# Patient Record
Sex: Male | Born: 1990 | Race: Black or African American | Hispanic: No | Marital: Single | Smoking: Current every day smoker
Health system: Southern US, Community
[De-identification: ages and names within clinical notes are randomized; demographics above are authoritative.]

## PROBLEM LIST (undated history)

## (undated) DIAGNOSIS — F319 Bipolar disorder, unspecified: Secondary | ICD-10-CM

## (undated) DIAGNOSIS — F909 Attention-deficit hyperactivity disorder, unspecified type: Secondary | ICD-10-CM

## (undated) DIAGNOSIS — J45909 Unspecified asthma, uncomplicated: Secondary | ICD-10-CM

## (undated) DIAGNOSIS — F209 Schizophrenia, unspecified: Secondary | ICD-10-CM

---
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
## 2010-07-13 LAB — GC/CHLAMYDIA PROBE AMP, GENITAL
Chlamydia, DNA Probe: POSITIVE — AB
GC Probe Amp, Genital: NEGATIVE

## 2010-12-18 ENCOUNTER — Inpatient Hospital Stay (INDEPENDENT_AMBULATORY_CARE_PROVIDER_SITE_OTHER)
Admission: RE | Admit: 2010-12-18 | Discharge: 2010-12-18 | Disposition: A | Discharge status: Home / Self Care | Payer: Medicaid Other | Source: Ambulatory Visit | Attending: Family Medicine | Admitting: Family Medicine

## 2010-12-18 DIAGNOSIS — N476 Balanoposthitis: Secondary | ICD-10-CM

## 2010-12-19 LAB — RPR: RPR Ser Ql: NONREACTIVE

## 2012-03-03 ENCOUNTER — Emergency Department (HOSPITAL_COMMUNITY)
Admission: EM | Admit: 2012-03-03 | Discharge: 2012-03-03 | Disposition: A | Discharge status: Home / Self Care | Payer: Medicaid Other | Attending: Emergency Medicine | Admitting: Emergency Medicine

## 2012-03-03 ENCOUNTER — Emergency Department (HOSPITAL_COMMUNITY): Payer: Medicaid Other

## 2012-03-03 ENCOUNTER — Encounter (HOSPITAL_COMMUNITY): Payer: Self-pay | Admitting: Emergency Medicine

## 2012-03-03 DIAGNOSIS — J45909 Unspecified asthma, uncomplicated: Secondary | ICD-10-CM | POA: Insufficient documentation

## 2012-03-03 DIAGNOSIS — R0981 Nasal congestion: Secondary | ICD-10-CM

## 2012-03-03 DIAGNOSIS — J40 Bronchitis, not specified as acute or chronic: Secondary | ICD-10-CM | POA: Insufficient documentation

## 2012-03-03 DIAGNOSIS — J3489 Other specified disorders of nose and nasal sinuses: Secondary | ICD-10-CM | POA: Insufficient documentation

## 2012-03-03 DIAGNOSIS — F17200 Nicotine dependence, unspecified, uncomplicated: Secondary | ICD-10-CM | POA: Insufficient documentation

## 2012-03-03 HISTORY — DX: Unspecified asthma, uncomplicated: J45.909

## 2012-03-03 MED ORDER — PSEUDOEPHEDRINE HCL ER 120 MG PO TB12: 120.0000 mg | ORAL_TABLET | Freq: Two times a day (BID) | ORAL | Status: DC

## 2012-03-03 MED ORDER — PSEUDOEPHEDRINE HCL ER 120 MG PO TB12
120.0000 mg | ORAL_TABLET | Freq: Two times a day (BID) | ORAL | Status: DC
  Administered 2012-03-03: 120 mg via ORAL
  Filled 2012-03-03: qty 1

## 2012-03-03 MED ORDER — SALINE SPRAY 0.65 % NA SOLN
1.0000 | Freq: Once | NASAL | Status: AC
  Administered 2012-03-03: 1 via NASAL
  Filled 2012-03-03: qty 44

## 2012-03-03 MED ORDER — ALBUTEROL SULFATE HFA 108 (90 BASE) MCG/ACT IN AERS
2.0000 | INHALATION_SPRAY | Freq: Four times a day (QID) | RESPIRATORY_TRACT | Status: DC
  Administered 2012-03-03: 2 via RESPIRATORY_TRACT
  Filled 2012-03-03: qty 6.7

## 2012-03-03 NOTE — ED Provider Notes (Signed)
History     CSN: [id number]  Arrival date & time 03/03/12  0206   First MD Initiated Contact with Patient 03/03/12 0308      Chief Complaint  Patient presents with  . Shortness of Breath    (Consider location/radiation/quality/duration/timing/severity/associated sxs/prior treatment) HPI Comments: Patient has URI, symptoms, rhinitis, nonproductive cough.  He, feels, is no his cough is getting worse.  Denies any fever, states she's been taking multiple over-the-counter medications, with no relief  Patient is a 21 y.o. male presenting with shortness of breath. The history is provided by the patient.  Shortness of Breath  The current episode started more than 1 week ago. The problem has been gradually worsening. The problem is moderate. Nothing relieves the symptoms. Nothing aggravates the symptoms. Associated symptoms include rhinorrhea, cough, shortness of breath and wheezing. Pertinent negatives include no chest pain and no fever.    Past Medical History  Diagnosis Date  . Asthma     History reviewed. No pertinent past surgical history.  No family history on file.  History  Substance Use Topics  . Smoking status: Current Every Day Smoker  . Smokeless tobacco: Not on file  . Alcohol Use: No      Review of Systems  Constitutional: Negative for fever and chills.  HENT: Positive for congestion and rhinorrhea.   Respiratory: Positive for cough, shortness of breath and wheezing.   Cardiovascular: Negative for chest pain.  Neurological: Negative for dizziness, weakness and headaches.    Allergies  Mushroom extract complex; Oatmeal; and Risperdal  Home Medications  No current outpatient prescriptions on file.  BP 145/98  Pulse 86  Temp 97.9 F (36.6 C) (Oral)  Resp 14  SpO2 100%  Physical Exam  Constitutional: He appears well-developed and well-nourished.  HENT:  Head: Normocephalic.  Mouth/Throat: Oropharynx is clear and moist.  Eyes: Pupils are equal, round,  and reactive to light.  Neck: Normal range of motion.  Cardiovascular: Normal rate.   Pulmonary/Chest: No respiratory distress. He has wheezes.  Musculoskeletal: Normal range of motion.  Skin: Skin is warm.    ED Course  Procedures (including critical care time)  Labs Reviewed - No data to display Dg Chest 2 View  03/03/2012  *RADIOLOGY REPORT*  Clinical Data: Shortness of breath, cough and fever.  History of asthma.  CHEST - 2 VIEW  Comparison: None.  Findings: The lungs are well-aerated and clear.  There is no evidence of focal opacification, pleural effusion or pneumothorax.  The heart is normal in size; the mediastinal contour is within normal limits.  No acute osseous abnormalities are seen.  IMPRESSION: No acute cardiopulmonary process seen.   Original Report Authenticated By: Tonia Ghent, M.D.      No diagnosis found.    MDM   Chest x-ray is reviewed.  There is no indication of pneumonia.  He was treated with albuterol inhaler, Sudafed, and saline nasal spray        Arman Filter, NP 03/03/12 1478

## 2012-03-03 NOTE — ED Notes (Signed)
The patient is AOx4 and comfortable with his discharge instructions. 

## 2012-03-03 NOTE — ED Provider Notes (Signed)
Medical screening examination/treatment/procedure(s) were performed by non-physician practitioner and as supervising physician I was immediately available for consultation/collaboration.    Vida Roller, MD 03/03/12 [phone]

## 2012-03-03 NOTE — ED Notes (Signed)
PT. ARRIVED WITH PTAR REPORTS SOB WITH PRODUCTIVE COUGH AND  NASAL CONGESTION ONSET 1 WEEK AGO.

## 2012-07-05 ENCOUNTER — Emergency Department (HOSPITAL_COMMUNITY)
Admission: EM | Admit: 2012-07-05 | Discharge: 2012-07-06 | Disposition: A | Discharge status: Home / Self Care | Payer: Medicaid Other | Attending: Emergency Medicine | Admitting: Emergency Medicine

## 2012-07-05 ENCOUNTER — Encounter (HOSPITAL_COMMUNITY): Payer: Self-pay | Admitting: Emergency Medicine

## 2012-07-05 DIAGNOSIS — F329 Major depressive disorder, single episode, unspecified: Secondary | ICD-10-CM

## 2012-07-05 DIAGNOSIS — R45851 Suicidal ideations: Secondary | ICD-10-CM

## 2012-07-05 DIAGNOSIS — M549 Dorsalgia, unspecified: Secondary | ICD-10-CM | POA: Insufficient documentation

## 2012-07-05 DIAGNOSIS — F3130 Bipolar disorder, current episode depressed, mild or moderate severity, unspecified: Secondary | ICD-10-CM | POA: Insufficient documentation

## 2012-07-05 DIAGNOSIS — Z8659 Personal history of other mental and behavioral disorders: Secondary | ICD-10-CM | POA: Insufficient documentation

## 2012-07-05 DIAGNOSIS — F17200 Nicotine dependence, unspecified, uncomplicated: Secondary | ICD-10-CM | POA: Insufficient documentation

## 2012-07-05 DIAGNOSIS — F319 Bipolar disorder, unspecified: Secondary | ICD-10-CM | POA: Insufficient documentation

## 2012-07-05 HISTORY — DX: Schizophrenia, unspecified: F20.9

## 2012-07-05 HISTORY — DX: Bipolar disorder, unspecified: F31.9

## 2012-07-05 HISTORY — DX: Attention-deficit hyperactivity disorder, unspecified type: F90.9

## 2012-07-05 LAB — CBC
HCT: 40.4 % (ref 39.0–52.0)
MCHC: 34.4 g/dL (ref 30.0–36.0)
MCV: 82.6 fL (ref 78.0–100.0)
Platelets: 215 10*3/uL (ref 150–400)
RDW: 13.9 % (ref 11.5–15.5)

## 2012-07-05 LAB — COMPREHENSIVE METABOLIC PANEL
AST: 38 U/L — ABNORMAL HIGH (ref 0–37)
Albumin: 3.9 g/dL (ref 3.5–5.2)
BUN: 12 mg/dL (ref 6–23)
Creatinine, Ser: 1.06 mg/dL (ref 0.50–1.35)
Potassium: 3.7 mEq/L (ref 3.5–5.1)
Total Protein: 6.5 g/dL (ref 6.0–8.3)

## 2012-07-05 LAB — RAPID URINE DRUG SCREEN, HOSP PERFORMED
Amphetamines: NOT DETECTED
Benzodiazepines: NOT DETECTED
Cocaine: NOT DETECTED

## 2012-07-05 LAB — SALICYLATE LEVEL: Salicylate Lvl: <2 mg/dL — ABNORMAL LOW (ref 2.8–20.0)

## 2012-07-05 LAB — ACETAMINOPHEN LEVEL: Acetaminophen (Tylenol), Serum: <15 ug/mL (ref 10–30)

## 2012-07-05 LAB — ETHANOL: Alcohol, Ethyl (B): <11 mg/dL (ref 0–11)

## 2012-07-05 MED ORDER — IBUPROFEN 400 MG PO TABS: 600.0000 mg | ORAL_TABLET | Freq: Three times a day (TID) | ORAL | Status: DC | PRN

## 2012-07-05 MED ORDER — ALUM & MAG HYDROXIDE-SIMETH 200-200-20 MG/5ML PO SUSP: 30.0000 mL | ORAL | Status: DC | PRN

## 2012-07-05 MED ORDER — ZOLPIDEM TARTRATE 5 MG PO TABS: 5.0000 mg | ORAL_TABLET | Freq: Every evening | ORAL | Status: DC | PRN

## 2012-07-05 MED ORDER — IBUPROFEN 800 MG PO TABS: 800.0000 mg | ORAL_TABLET | Freq: Once | ORAL | Status: DC

## 2012-07-05 MED ORDER — NICOTINE 21 MG/24HR TD PT24: 21.0000 mg | MEDICATED_PATCH | Freq: Every day | TRANSDERMAL | Status: DC

## 2012-07-05 MED ORDER — ACETAMINOPHEN 325 MG PO TABS: 650.0000 mg | ORAL_TABLET | ORAL | Status: DC | PRN

## 2012-07-05 MED ORDER — ONDANSETRON HCL 8 MG PO TABS: 4.0000 mg | ORAL_TABLET | Freq: Three times a day (TID) | ORAL | Status: DC | PRN

## 2012-07-05 NOTE — ED Notes (Signed)
Patient sleeping comfortably and is without any complaints at this time. Will continue to monitor.
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
## 2012-07-05 NOTE — ED Provider Notes (Signed)
Medical screening examination/treatment/procedure(s) were performed by non-physician practitioner and as supervising physician I was immediately available for consultation/collaboration.   Whitney Plunkett, MD 07/05/12 2347 

## 2012-07-05 NOTE — ED Notes (Signed)
Patient belongings inventoried and secured in ED lockers #10 & 11
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
## 2012-07-05 NOTE — ED Notes (Signed)
Pt ask for something to eat. Pt given a happy meal and sprite to drink.

## 2012-07-05 NOTE — ED Notes (Signed)
Pt changing into blue paper scrubs.  Christa, Consulting civil engineer notified of pt and security called to wand pt.
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
## 2012-07-06 MED ORDER — DIVALPROEX SODIUM 250 MG PO DR TAB: 250.0000 mg | DELAYED_RELEASE_TABLET | Freq: Two times a day (BID) | ORAL | Status: DC
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
## 2012-07-06 NOTE — ED Notes (Signed)
Patient resting comfortably. No needs assessed at this time. Will continue to monitor.  

## 2012-07-06 NOTE — ED Notes (Signed)
Case manager in to see pt to address his housing need

## 2012-07-06 NOTE — ED Notes (Signed)
Patient sleeping comfortably. No needs assessed at this time. Will continue to monitor.  

## 2012-07-06 NOTE — BH Assessment (Signed)
Assessment Note  Update:  Pt received telepsych and discharge recommended by psychiatrist, as pt denies current SI/HI or psychosis.  Pt also had medication recommendations and prescription was provided by EDP Knapp.  PJ Minnich, RNCM, provided housing resources to pt.  Pt was given outpatient referrals to [facility] and other facilities as well as crisis hotline information.  Pt refused to sign a No Harm or No Violence contract.  Pt to be discharged.  Updated ED staff.  Updated assessment disposition, completed assessment notification and faxed to [facility] to log.    Disposition:  Disposition Initial Assessment Completed: Yes Disposition of Patient: Referred to;Outpatient treatment Type of inpatient treatment program: Adult Type of outpatient treatment: Adult Patient referred to: Outpatient clinic referral  On Site Evaluation by:   Reviewed with Physician:  Pat Patrick, Rennis Harding 07/06/2012 11:36 AM

## 2012-07-06 NOTE — ED Provider Notes (Addendum)
Pt resting this morning.  VSS.  Will continue to monitor.  Awaiting psychiatric disposition.  Filed Vitals:   07/06/12 0627  BP: 97/53  Pulse: 55  Temp:   Resp: 18     Celene Kras, MD 07/06/12 [phone]  Pt was evaluated by Dr Leretha Pol this morning.  Pt denies SI at this time.  He discused the stressors with Dr Leretha Pol.  She recommends discharge and starting him on Depakote 250 mg BID.  Will ask social work to help him with finding a shelter.  Celene Kras, MD 07/06/12 [phone]

## 2012-07-06 NOTE — BH Assessment (Signed)
[facility] Assessment Progress Note      Update:  Called [place], no beds per [facility] @ 0805.  Called [place] and no beds per [facility] @ [phone].  Called [place] and no beds per [facility] @ [phone].  Called [place] and beds available per [facility] @ [phone].  Referral faxed for review.  Called [facility] and beds available per Alecia Lemming @ [phone].  Referral faxed for review.

## 2012-07-06 NOTE — ED Notes (Signed)
Patient sleeping comfortably. No needs assessed at this time. Will continue to monitor.
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
## 2012-07-06 NOTE — [facility] Counselor (Signed)
Patient has been accepted at [facility] by Neil Mashburn PA pending bed availability. 

## 2012-07-06 NOTE — ED Notes (Signed)
Patient is sleeping comfortably. No needs assessed at this time. Will continue to monitor.  

## 2014-06-24 ENCOUNTER — Encounter (HOSPITAL_COMMUNITY): Payer: Self-pay | Admitting: Emergency Medicine

## 2014-06-24 ENCOUNTER — Emergency Department (HOSPITAL_COMMUNITY)
Admission: EM | Admit: 2014-06-24 | Discharge: 2014-06-24 | Disposition: A | Discharge status: Home / Self Care | Payer: Medicaid Other | Attending: Emergency Medicine | Admitting: Emergency Medicine

## 2014-06-24 DIAGNOSIS — R51 Headache: Secondary | ICD-10-CM | POA: Diagnosis not present

## 2014-06-24 DIAGNOSIS — R0981 Nasal congestion: Secondary | ICD-10-CM

## 2014-06-24 DIAGNOSIS — Z720 Tobacco use: Secondary | ICD-10-CM | POA: Insufficient documentation

## 2014-06-24 DIAGNOSIS — Z79899 Other long term (current) drug therapy: Secondary | ICD-10-CM | POA: Insufficient documentation

## 2014-06-24 DIAGNOSIS — F319 Bipolar disorder, unspecified: Secondary | ICD-10-CM | POA: Insufficient documentation

## 2014-06-24 DIAGNOSIS — J45909 Unspecified asthma, uncomplicated: Secondary | ICD-10-CM | POA: Insufficient documentation

## 2014-06-24 DIAGNOSIS — R519 Headache, unspecified: Secondary | ICD-10-CM

## 2014-06-24 MED ORDER — KETOROLAC TROMETHAMINE 30 MG/ML IJ SOLN
30.0000 mg | Freq: Once | INTRAMUSCULAR | Status: AC
  Administered 2014-06-24: 30 mg via INTRAVENOUS
  Filled 2014-06-24: qty 1

## 2014-06-24 MED ORDER — IBUPROFEN 800 MG PO TABS
800.0000 mg | ORAL_TABLET | Freq: Three times a day (TID) | ORAL | Status: DC
Start: 2014-06-24 — End: 2016-01-29

## 2014-06-24 MED ORDER — ONDANSETRON HCL 4 MG/2ML IJ SOLN
4.0000 mg | Freq: Once | INTRAMUSCULAR | Status: AC
  Administered 2014-06-24: 4 mg via INTRAVENOUS
  Filled 2014-06-24: qty 2

## 2014-06-24 NOTE — Discharge Instructions (Signed)
Take the prescribed medication as directed shoulder headache recur. Return to the ED for new or worsening symptoms.

## 2014-06-24 NOTE — ED Notes (Signed)
Pt arrives with c/o headache ongoing since yesterday, nausea. States he tried Programmer, systemsalza seltzer for headache and was ineffective. R sided headache, no hx of migraines.

## 2014-06-24 NOTE — ED Provider Notes (Signed)
CSN: [id number]     Arrival date & time 06/24/14  0618 History   First MD Initiated Contact with Patient 06/24/14 [phone]     Chief Complaint  Patient presents with  . Headache  . Nasal Congestion     (Consider location/radiation/quality/duration/timing/severity/associated sxs/prior Treatment) The history is provided by the patient and medical records.    This is a 24 y.o. M with PMH significant for bipolar disorder, ADHD, schizophrenia, asthma, presenting to the ED for headache.  Patient states headache began yesterday evening, localized to the right side of his head, throbbing in nature.  He denies associated photophobia, phonophobia, dizziness, aura, visual disturbance, tinnitus, numbness, paresthesias, or weakness.  Patient has no hx of migraines.  He notes associated nausea but denies vomiting.  No abdominal pain.  No fever, chills, sweats.  No neck pain or stiffness.  Patient also notes some nasal congestion which began yesterday.  He denies any cough, chest pain, or SOB.  No known sick contacts. Patient took alka seltzer PTA, no other medications tried.  VSS on arrival.  Past Medical History  Diagnosis Date  . Bipolar 1 disorder   . ADHD (attention deficit hyperactivity disorder)   . Schizophrenia   . Asthma    History reviewed. No pertinent past surgical history. No family history on file. History  Substance Use Topics  . Smoking status: Current Every Day Smoker  . Smokeless tobacco: Not on file  . Alcohol Use: Yes    Review of Systems  HENT: Positive for congestion.   Neurological: Positive for headaches.  All other systems reviewed and are negative.     Allergies  Klonopin; Other; and Risperdal  Home Medications   Prior to Admission medications   Medication Sig Start Date End Date Taking? Authorizing Provider  divalproex (DEPAKOTE) 250 MG DR tablet Take 1 tablet (250 mg total) by mouth 2 (two) times daily. 07/06/12   Linwood Dibbles, MD   BP 126/84 mmHg  Pulse 62   Temp(Src) 98.7 F (37.1 C) (Oral)  Resp 16  Ht  (1.727 m)  Wt 195 lb (88.451 kg)  BMI 29.66 kg/m2  SpO2 100%   Physical Exam  Constitutional: He is oriented to person, place, and time. He appears well-developed and well-nourished.  HENT:  Head: Normocephalic and atraumatic.  Right Ear: Tympanic membrane normal.  Left Ear: Tympanic membrane and ear canal normal.  Nose: Mucosal edema present.  Mouth/Throat: Uvula is midline, oropharynx is clear and moist and mucous membranes are normal. No oropharyngeal exudate, posterior oropharyngeal edema, posterior oropharyngeal erythema or tonsillar abscesses.  Nasal congestion noted; Tonsils normal in appearance bilaterally without exudate; uvula midline without peritonsillar abscess; handling secretions appropriately; no difficulty swallowing or speaking  Eyes: Conjunctivae and EOM are normal. Pupils are equal, round, and reactive to light.  Neck: Normal range of motion and full passive range of motion without pain. Neck supple. No muscular tenderness present. No rigidity. No edema present.  No meningeal signs  Cardiovascular: Normal rate, regular rhythm and normal heart sounds.   Pulmonary/Chest: Effort normal and breath sounds normal.  Abdominal: Soft. Bowel sounds are normal.  Musculoskeletal: Normal range of motion.  Neurological: He is alert and oriented to person, place, and time.  AAOx3, answering questions and following commands appropriately; equal strength UE and LE bilaterally; CN grossly intact; moves all extremities appropriately without ataxia; no focal neuro deficits or facial asymmetry appreciated  Skin: Skin is warm and dry.  Psychiatric: He has a normal mood and  affect.  Nursing note and vitals reviewed.   ED Course  Procedures (including critical care time) Labs Review Labs Reviewed - No data to display  Imaging Review No results found.   EKG Interpretation None      MDM   Final diagnoses:  Headache,  unspecified headache type  Nasal congestion   24 year old male with progressively worsening headache past 24 hours. He has associated nausea but denies vomiting. On exam, patient afebrile and nontoxic in appearance. He has no signs of meningitis. Neurologic exam is nonfocal. Nasal congestion without clinical signs of infection.  Suspect uncomplicated headache with URI. Patient given toradol and Zofran with resolution of symptoms.  Patient will be d/c home with motrin.  Discussed plan with patient, he/she acknowledged understanding and agreed with plan of care.  Return precautions given for new or worsening symptoms.  Garlon HatchetLisa M Versie Fleener, PA-C 06/24/14 0830  Lyanne CoKevin M Campos, MD 06/26/14 2223

## 2014-06-28 ENCOUNTER — Emergency Department (HOSPITAL_COMMUNITY)
Admission: EM | Admit: 2014-06-28 | Discharge: 2014-06-28 | Disposition: A | Discharge status: Home / Self Care | Payer: Medicaid Other | Source: Home / Self Care | Attending: Family Medicine | Admitting: Family Medicine

## 2014-06-28 ENCOUNTER — Emergency Department (INDEPENDENT_AMBULATORY_CARE_PROVIDER_SITE_OTHER): Payer: Medicaid Other

## 2014-06-28 ENCOUNTER — Encounter (HOSPITAL_COMMUNITY): Payer: Self-pay | Admitting: Emergency Medicine

## 2014-06-28 ENCOUNTER — Other Ambulatory Visit (HOSPITAL_COMMUNITY)
Admission: RE | Admit: 2014-06-28 | Discharge: 2014-06-28 | Disposition: A | Discharge status: Home / Self Care | Payer: Medicaid Other | Source: Ambulatory Visit | Attending: Family Medicine | Admitting: Family Medicine

## 2014-06-28 DIAGNOSIS — Z113 Encounter for screening for infections with a predominantly sexual mode of transmission: Secondary | ICD-10-CM | POA: Insufficient documentation

## 2014-06-28 DIAGNOSIS — N481 Balanitis: Secondary | ICD-10-CM

## 2014-06-28 DIAGNOSIS — J209 Acute bronchitis, unspecified: Secondary | ICD-10-CM

## 2014-06-28 MED ORDER — CLOTRIMAZOLE-BETAMETHASONE 1-0.05 % EX CREA: TOPICAL_CREAM | CUTANEOUS | Status: DC

## 2014-06-28 MED ORDER — PREDNISONE 50 MG PO TABS: 50.0000 mg | ORAL_TABLET | Freq: Every day | ORAL | Status: DC

## 2014-06-28 MED ORDER — ALBUTEROL SULFATE HFA 108 (90 BASE) MCG/ACT IN AERS: 2.0000 | INHALATION_SPRAY | RESPIRATORY_TRACT | Status: DC | PRN

## 2014-06-28 NOTE — ED Provider Notes (Signed)
CSN: [id number]     Arrival date & time 06/28/14  0827 History   First MD Initiated Contact with Patient 06/28/14 [phone]     Chief Complaint  Patient presents with  . URI  . Rash    penile irritation   (Consider location/radiation/quality/duration/timing/severity/associated sxs/prior Treatment) HPI        24 year old male presents complaining of cough, chest pain, coughing, shortness of breath tonight, wheezing at night. This started about 3 days ago and has been gradually worsening. Also he has chills and diaphoresis at night. His symptoms are present during the day but is worse at night. Has a history of asthma feels like that may be affecting how he feels. He has sick contacts with a similar illness. No NVD. Also he complains of itching around the glans of his penis. This started 5 days ago after he had unprotected sex. No discharge or testicular pain.  Past Medical History  Diagnosis Date  . Bipolar 1 disorder   . ADHD (attention deficit hyperactivity disorder)   . Schizophrenia   . Asthma    History reviewed. No pertinent past surgical history. History reviewed. No pertinent family history. History  Substance Use Topics  . Smoking status: Current Every Day Smoker  . Smokeless tobacco: Not on file  . Alcohol Use: Yes    Review of Systems  Constitutional: Positive for chills. Negative for fever.  HENT: Positive for congestion and sore throat. Negative for ear pain, nosebleeds and sinus pressure.   Respiratory: Positive for cough and shortness of breath.   Cardiovascular: Positive for chest pain (with coughing).  Gastrointestinal: Negative for nausea, vomiting, abdominal pain and diarrhea.  Genitourinary: Positive for genital sores. Negative for dysuria, urgency, discharge, penile swelling, scrotal swelling, penile pain and testicular pain.       Itching around base of glans   Musculoskeletal: Negative for myalgias and arthralgias.  Skin: Negative for rash.  All other systems  reviewed and are negative.   Allergies  Klonopin; Other; and Risperdal  Home Medications   Prior to Admission medications   Medication Sig Start Date End Date Taking? Authorizing Provider  albuterol (PROVENTIL HFA;VENTOLIN HFA) 108 (90 BASE) MCG/ACT inhaler Inhale 2 puffs into the lungs every 4 (four) hours as needed for wheezing. 06/28/14   Graylon GoodZachary H Kano Heckmann, PA-C  clotrimazole-betamethasone (LOTRISONE) cream Apply to affected area 2 times daily prn 06/28/14   Graylon GoodZachary H Alverta Caccamo, PA-C  divalproex (DEPAKOTE) 250 MG DR tablet Take 1 tablet (250 mg total) by mouth 2 (two) times daily. Patient not taking: Reported on 06/24/2014 07/06/12   Linwood DibblesJon Knapp, MD  ibuprofen (ADVIL,MOTRIN) 800 MG tablet Take 1 tablet (800 mg total) by mouth 3 (three) times daily. 06/24/14   Garlon HatchetLisa M Sanders, PA-C  predniSONE (DELTASONE) 50 MG tablet Take 1 tablet (50 mg total) by mouth daily with breakfast. 06/28/14   Graylon GoodZachary H Hilery Wintle, PA-C   BP 148/93 mmHg  Pulse 75  Temp(Src) 98.2 F (36.8 C) (Oral)  Resp 16  SpO2 98% Physical Exam  Constitutional: He is oriented to person, place, and time. He appears well-developed and well-nourished. No distress.  HENT:  Head: Normocephalic and atraumatic.  Right Ear: External ear normal.  Left Ear: External ear normal.  Nose: Nose normal.  Mouth/Throat: Oropharynx is clear and moist. No oropharyngeal exudate.  Eyes: Conjunctivae are normal.  Neck: Normal range of motion. Neck supple.  Cardiovascular: Normal rate and regular rhythm.   Pulmonary/Chest: Effort normal and breath sounds normal. No respiratory distress.  He has no wheezes. He has no rales.  Genitourinary: Testes normal. Circumcised. No penile erythema or penile tenderness. No discharge found.  Single 3mm papule on the left side base of the glans penis   Lymphadenopathy:    He has no cervical adenopathy.       Right: No inguinal adenopathy present.       Left: No inguinal adenopathy present.  Neurological: He is alert and  oriented to person, place, and time. Coordination normal.  Skin: Skin is warm and dry. No rash noted. He is not diaphoretic.  Psychiatric: He has a normal mood and affect. Judgment normal.  Nursing note and vitals reviewed.   ED Course  Procedures (including critical care time) Labs Review Labs Reviewed  HERPES SIMPLEX VIRUS CULTURE  RPR  HIV ANTIBODY (ROUTINE TESTING)  URINE CYTOLOGY ANCILLARY ONLY    Imaging Review Dg Chest 2 View  06/28/2014   CLINICAL DATA:  Cough, shortness of breath, flu-like symptoms  EXAM: CHEST  2 VIEW  COMPARISON:  None.  FINDINGS: Cardiomediastinal silhouette is unremarkable. No acute infiltrate or pleural effusion. No pulmonary edema. Bony thorax is unremarkable.  IMPRESSION: No active cardiopulmonary disease.   Electronically Signed   By: Natasha Mead M.D.   On: 06/28/2014 10:03     MDM   1. Acute bronchitis, unspecified organism   2. Balanitis    STD tests have been sent. 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  Treat balanitis with Lotrisone for now  The chest x-ray is normal. Most likely acute bronchitis, treat with prednisone and albuterol, follow-up when necessary. We will call with any positive results.     Meds ordered this encounter  Medications  . clotrimazole-betamethasone (LOTRISONE) cream    Sig: Apply to affected area 2 times daily prn    Dispense:  15 g    Refill:  0  . albuterol (PROVENTIL HFA;VENTOLIN HFA) 108 (90 BASE) MCG/ACT inhaler    Sig: Inhale 2 puffs into the lungs every 4 (four) hours as needed for wheezing.    Dispense:  1 Inhaler    Refill:  0  . predniSONE (DELTASONE) 50 MG tablet    Sig: Take 1 tablet (50 mg total) by mouth daily with breakfast.    Dispense:  5 tablet    Refill:  0      Graylon Good, PA-C 06/28/14 1015

## 2014-06-28 NOTE — ED Notes (Signed)
Front desk, adam, reports this patient may have left

## 2014-06-28 NOTE — Discharge Instructions (Signed)
Acute Bronchitis Bronchitis is inflammation of the airways that extend from the windpipe into the lungs (bronchi). The inflammation often causes mucus to develop. This leads to a cough, which is the most common symptom of bronchitis.  In acute bronchitis, the condition usually develops suddenly and goes away over time, usually in a couple weeks. Smoking, allergies, and asthma can make bronchitis worse. Repeated episodes of bronchitis may cause further lung problems.  CAUSES Acute bronchitis is most often caused by the same virus that causes a cold. The virus can spread from person to person (contagious) through coughing, sneezing, and touching contaminated objects. SIGNS AND SYMPTOMS   Cough.   Fever.   Coughing up mucus.   Body aches.   Chest congestion.   Chills.   Shortness of breath.   Sore throat.  DIAGNOSIS  Acute bronchitis is usually diagnosed through a physical exam. Your health care provider will also ask you questions about your medical history. Tests, such as chest X-rays, are sometimes done to rule out other conditions.  TREATMENT  Acute bronchitis usually goes away in a couple weeks. Oftentimes, no medical treatment is necessary. Medicines are sometimes given for relief of fever or cough. Antibiotic medicines are usually not needed but may be prescribed in certain situations. In some cases, an inhaler may be recommended to help reduce shortness of breath and control the cough. A cool mist vaporizer may also be used to help thin bronchial secretions and make it easier to clear the chest.  HOME CARE INSTRUCTIONS  Get plenty of rest.   Drink enough fluids to keep your urine clear or pale yellow (unless you have a medical condition that requires fluid restriction). Increasing fluids may help thin your respiratory secretions (sputum) and reduce chest congestion, and it will prevent dehydration.   Take medicines only as directed by your health care provider.  If  you were prescribed an antibiotic medicine, finish it all even if you start to feel better.  Avoid smoking and secondhand smoke. Exposure to cigarette smoke or irritating chemicals will make bronchitis worse. If you are a smoker, consider using nicotine gum or skin patches to help control withdrawal symptoms. Quitting smoking will help your lungs heal faster.   Reduce the chances of another bout of acute bronchitis by washing your hands frequently, avoiding people with cold symptoms, and trying not to touch your hands to your mouth, nose, or eyes.   Keep all follow-up visits as directed by your health care provider.  SEEK MEDICAL CARE IF: Your symptoms do not improve after 1 week of treatment.  SEEK IMMEDIATE MEDICAL CARE IF:  You develop an increased fever or chills.   You have chest pain.   You have severe shortness of breath.  You have bloody sputum.   You develop dehydration.  You faint or repeatedly feel like you are going to pass out.  You develop repeated vomiting.  You develop a severe headache. MAKE SURE YOU:   Understand these instructions.  Will watch your condition.  Will get help right away if you are not doing well or get worse. Document Released: 05/23/2004 Document Revised: 08/30/2013 Document Reviewed: 10/06/2012 [facility] Patient Information 2015 [place], [place]. This information is not intended to replace advice given to you by your health care provider. Make sure you discuss any questions you have with your health care provider.  Balanitis Balanitis is inflammation of the head of the penis (glans).  CAUSES  Balanitis has multiple causes, both infectious and noninfectious. Often  balanitis is the result of poor personal hygiene, especially in uncircumcised males. Without adequate washing, viruses, bacteria, and yeast collect between the foreskin and the glans. This can cause an infection. Lack of air and irritation from a normal secretion called smegma  contribute to the cause in uncircumcised males. Other causes include:  Chemical irritation from the use of certain soaps and shower gels (especially soaps with perfumes), condoms, personal lubricants, petroleum jelly, spermicides, and fabric conditioners.  Skin conditions, such as eczema, dermatitis, and psoriasis.  Allergies to drugs, such as tetracycline and sulfa.  Certain medical conditions, including liver cirrhosis, congestive heart failure, and kidney disease.  Morbid obesity. RISK FACTORS  Diabetes mellitus.  A tight foreskin that is difficult to pull back past the glans (phimosis).  Sex without the use of a condom. SIGNS AND SYMPTOMS  Symptoms may include:  Discharge coming from under the foreskin.  Tenderness.  Itching and inability to get an erection (because of the pain).  Redness and a rash.  Sores on the glans and on the foreskin. DIAGNOSIS Diagnosis of balanitis is confirmed through a physical exam. TREATMENT The treatment is based on the cause of the balanitis. Treatment may include:  Frequent cleansing.  Keeping the glans and foreskin dry.  Use of medicines such as creams, pain medicines, antibiotics, or medicines to treat fungal infections.  Sitz baths. If the irritation has caused a scar on the foreskin that prevents easy retraction, a circumcision may be recommended.  HOME CARE INSTRUCTIONS  Sex should be avoided until the condition has cleared. MAKE SURE YOU:  Understand these instructions.  Will watch your condition.  Will get help right away if you are not doing well or get worse. Document Released: 09/01/2008 Document Revised: 04/20/2013 Document Reviewed: 10/05/2012 [facility] Patient Information 2015 [place]. This information is not intended to replace advice given to you by your health care provider. Make sure you discuss any questions you have with your health care provider.

## 2014-06-28 NOTE — ED Notes (Signed)
C/o  URI symptoms.  On productive cough.  Sob, hx asthma.  States Sob worse at night.  Pain in chest with cough.  Congestion.    Also c/o penile itching after recent unprotected intercourse.  Denies pelvic/ abdominal pain.  No penile discharge.    Symptoms present the past 3 days.

## 2014-06-29 ENCOUNTER — Encounter (HOSPITAL_COMMUNITY): Payer: Self-pay | Admitting: Emergency Medicine

## 2014-06-29 LAB — HIV ANTIBODY (ROUTINE TESTING W REFLEX): HIV Screen 4th Generation wRfx: NONREACTIVE

## 2014-06-29 LAB — URINE CYTOLOGY ANCILLARY ONLY
Chlamydia: NEGATIVE
Neisseria Gonorrhea: NEGATIVE
Trichomonas: NEGATIVE

## 2014-06-29 LAB — RPR: RPR Ser Ql: NONREACTIVE

## 2014-06-30 LAB — HERPES SIMPLEX VIRUS CULTURE: CULTURE: NOT DETECTED

## 2016-01-29 ENCOUNTER — Encounter (HOSPITAL_COMMUNITY): Payer: Self-pay | Admitting: Emergency Medicine

## 2016-01-29 ENCOUNTER — Emergency Department (HOSPITAL_COMMUNITY)
Admission: EM | Admit: 2016-01-29 | Discharge: 2016-01-29 | Disposition: A | Discharge status: Home / Self Care | Payer: Medicaid Other | Attending: Emergency Medicine | Admitting: Emergency Medicine

## 2016-01-29 DIAGNOSIS — M79671 Pain in right foot: Secondary | ICD-10-CM | POA: Diagnosis present

## 2016-01-29 DIAGNOSIS — F17200 Nicotine dependence, unspecified, uncomplicated: Secondary | ICD-10-CM | POA: Insufficient documentation

## 2016-01-29 DIAGNOSIS — L84 Corns and callosities: Secondary | ICD-10-CM | POA: Insufficient documentation

## 2016-01-29 DIAGNOSIS — J45909 Unspecified asthma, uncomplicated: Secondary | ICD-10-CM | POA: Diagnosis not present

## 2016-01-29 DIAGNOSIS — Z79899 Other long term (current) drug therapy: Secondary | ICD-10-CM | POA: Diagnosis not present

## 2016-01-29 DIAGNOSIS — F909 Attention-deficit hyperactivity disorder, unspecified type: Secondary | ICD-10-CM | POA: Insufficient documentation

## 2016-01-29 MED ORDER — IBUPROFEN 800 MG PO TABS: 800.0000 mg | ORAL_TABLET | Freq: Three times a day (TID) | ORAL | 0 refills | Status: DC

## 2016-01-29 NOTE — ED Triage Notes (Signed)
Pt. reports chronic right heel pain for several months , denies injury / ambulatory , presents with small callous at right heel with no drainage .

## 2016-01-29 NOTE — ED Provider Notes (Signed)
[facility]-EMERGENCY DEPT Provider Note   CSN: [id number] Arrival date & time: 01/29/16  2007     History   Chief Complaint Chief Complaint  Patient presents with  . Foot Pain    HPI Antonio Spencer is a 25 y.o. male.  Patient is 25 yo M presenting with painful callous to right heel. He states the callous has been increasing in size for several months, and gotten progressively more painful. Pain is worse when walking, but no difficulty ambulating. Denies any drainage, warmth, redness, or tenderness to any other part of his foot. He denies any penetrating trauma or injury to his right foot.      Past Medical History:  Diagnosis Date  . ADHD (attention deficit hyperactivity disorder)   . Asthma   . Bipolar 1 disorder (HCC)   . Schizophrenia (HCC)     There are no active problems to display for this patient.   History reviewed. No pertinent surgical history.     Home Medications    Prior to Admission medications   Medication Sig Start Date End Date Taking? Authorizing Provider  albuterol (PROVENTIL HFA;VENTOLIN HFA) 108 (90 BASE) MCG/ACT inhaler Inhale 2 puffs into the lungs every 4 (four) hours as needed for wheezing. 06/28/14   Graylon Good, PA-C  clotrimazole-betamethasone (LOTRISONE) cream Apply to affected area 2 times daily prn 06/28/14   Graylon Good, PA-C  divalproex (DEPAKOTE) 250 MG DR tablet Take 1 tablet (250 mg total) by mouth 2 (two) times daily. Patient not taking: Reported on 06/24/2014 07/06/12   Linwood Dibbles, MD  ibuprofen (ADVIL,MOTRIN) 800 MG tablet Take 1 tablet (800 mg total) by mouth 3 (three) times daily. 06/24/14   Garlon Hatchet, PA-C  predniSONE (DELTASONE) 50 MG tablet Take 1 tablet (50 mg total) by mouth daily with breakfast. 06/28/14   Graylon Good, PA-C  pseudoephedrine (SUDAFED 12 HOUR) 120 MG 12 hr tablet Take 1 tablet (120 mg total) by mouth every 12 (twelve) hours. 03/03/12   Earley Favor, NP    Family History No family history on  file.  Social History Social History  Substance Use Topics  . Smoking status: Current Every Day Smoker  . Smokeless tobacco: Never Used  . Alcohol use Yes     Allergies   Klonopin [clonazepam]; Mushroom extract complex; Oatmeal; Other; Risperdal [risperidone]; and Risperdal [risperidone]   Review of Systems Review of Systems  Constitutional: Negative for chills and fever.  Musculoskeletal: Negative for gait problem and joint swelling.  Skin: Negative for color change and wound.     Physical Exam Updated Vital Signs BP 153/86 (BP Location: Left Arm)   Pulse 85   Temp 97.9 F (36.6 C) (Oral)   Resp 18   Ht 5\' 8"  (1.727 m)   Wt 90.7 kg   SpO2 98%   BMI 30.41 kg/m   Physical Exam  Constitutional: He appears well-developed and well-nourished. No distress.  HENT:  Head: Normocephalic and atraumatic.  Mouth/Throat: Oropharynx is clear and moist.  Eyes: Conjunctivae are normal.  Neck: Normal range of motion.  Cardiovascular: Normal rate.   Pulmonary/Chest: Effort normal. No respiratory distress.  Musculoskeletal: Normal range of motion.  No swelling, crepitus, or deformity noted to right ankle or heel. 2 cm callous noted on heel which is mildly TTP. No swelling or pain of fore foot. No bruising or erythema. No warmth.  Neurological: He is alert. Gait normal.  Skin: Skin is warm and dry.  Nursing note and vitals  reviewed.    ED Treatments / Results  Labs (all labs ordered are listed, but only abnormal results are displayed) Labs Reviewed - No data to display  EKG  EKG Interpretation None       Radiology No results found.  Procedures Procedures (including critical care time)  Medications Ordered in ED Medications - No data to display   Initial Impression / Assessment and Plan / ED Course  I have reviewed the triage vital signs and the nursing notes.  Pertinent labs & imaging results that were available during my care of the patient were reviewed by  me and considered in my medical decision making (see chart for details).  Clinical Course   Patient presents with painful callous to right heel that has been present for months. On physical exam, 2 cm callous noted, but no erythema, warmth, drainage, or signs of infection. No TTP right ankle, heel, or forefoot. Provided information to follow up with podiatrist, prescription for ibuprofen, and 1 day note for work. Advised to wear shoe insoles for comfort. Patient appreciative of care. Return precautions discussed for new or worsening symptoms.  Final Clinical Impressions(s) / ED Diagnoses   Final diagnoses:  Pre-ulcerative corn or callous    New Prescriptions Current Discharge Medication List       Jari PiggDaryl F de Villier II, [place] 01/29/16 2159    Margarita Grizzleanielle Ray, MD 02/01/16 [phone]

## 2016-02-01 IMAGING — DX DG CHEST 2V
2 series · 2 of 2 positions shown · non-contrast
Comparison: None.

CLINICAL DATA: Cough, shortness of breath, flu-like symptoms

EXAM:
CHEST  2 VIEW

[chest pa]
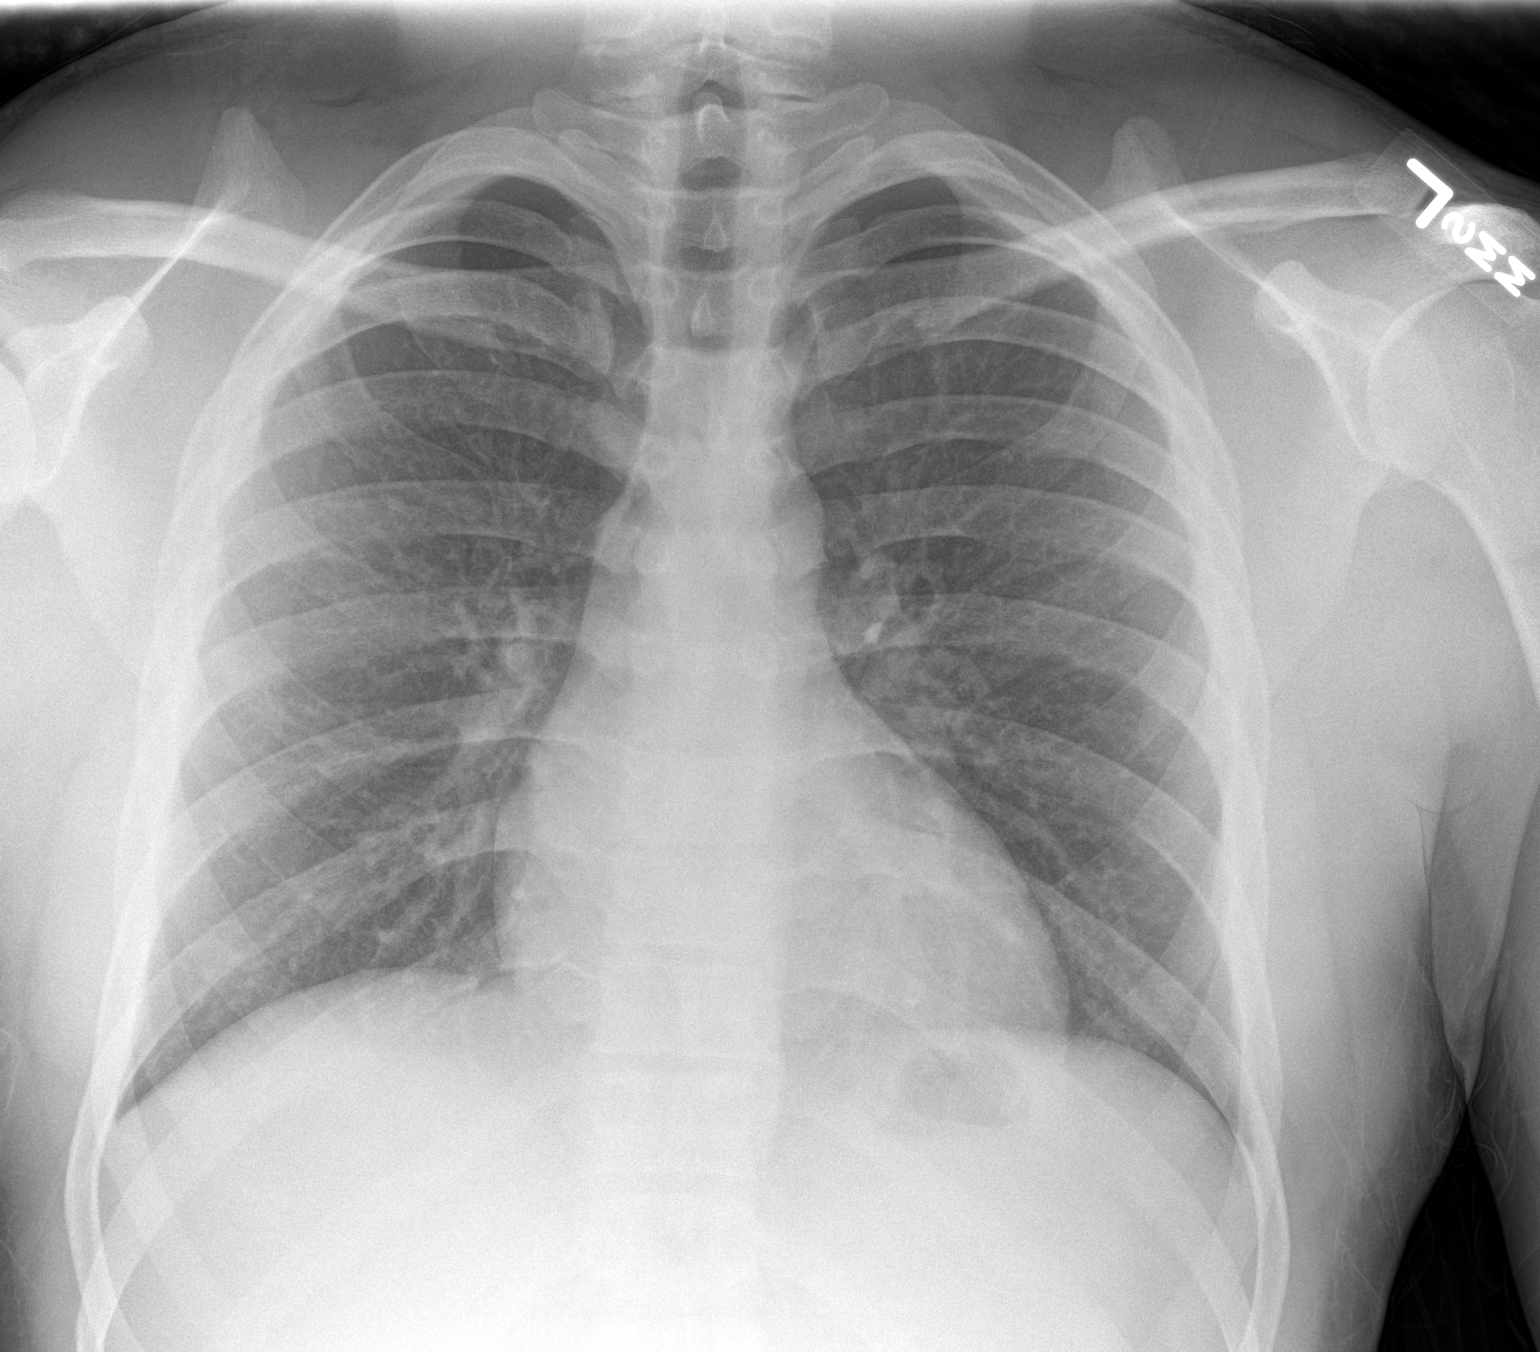

[chest lat]
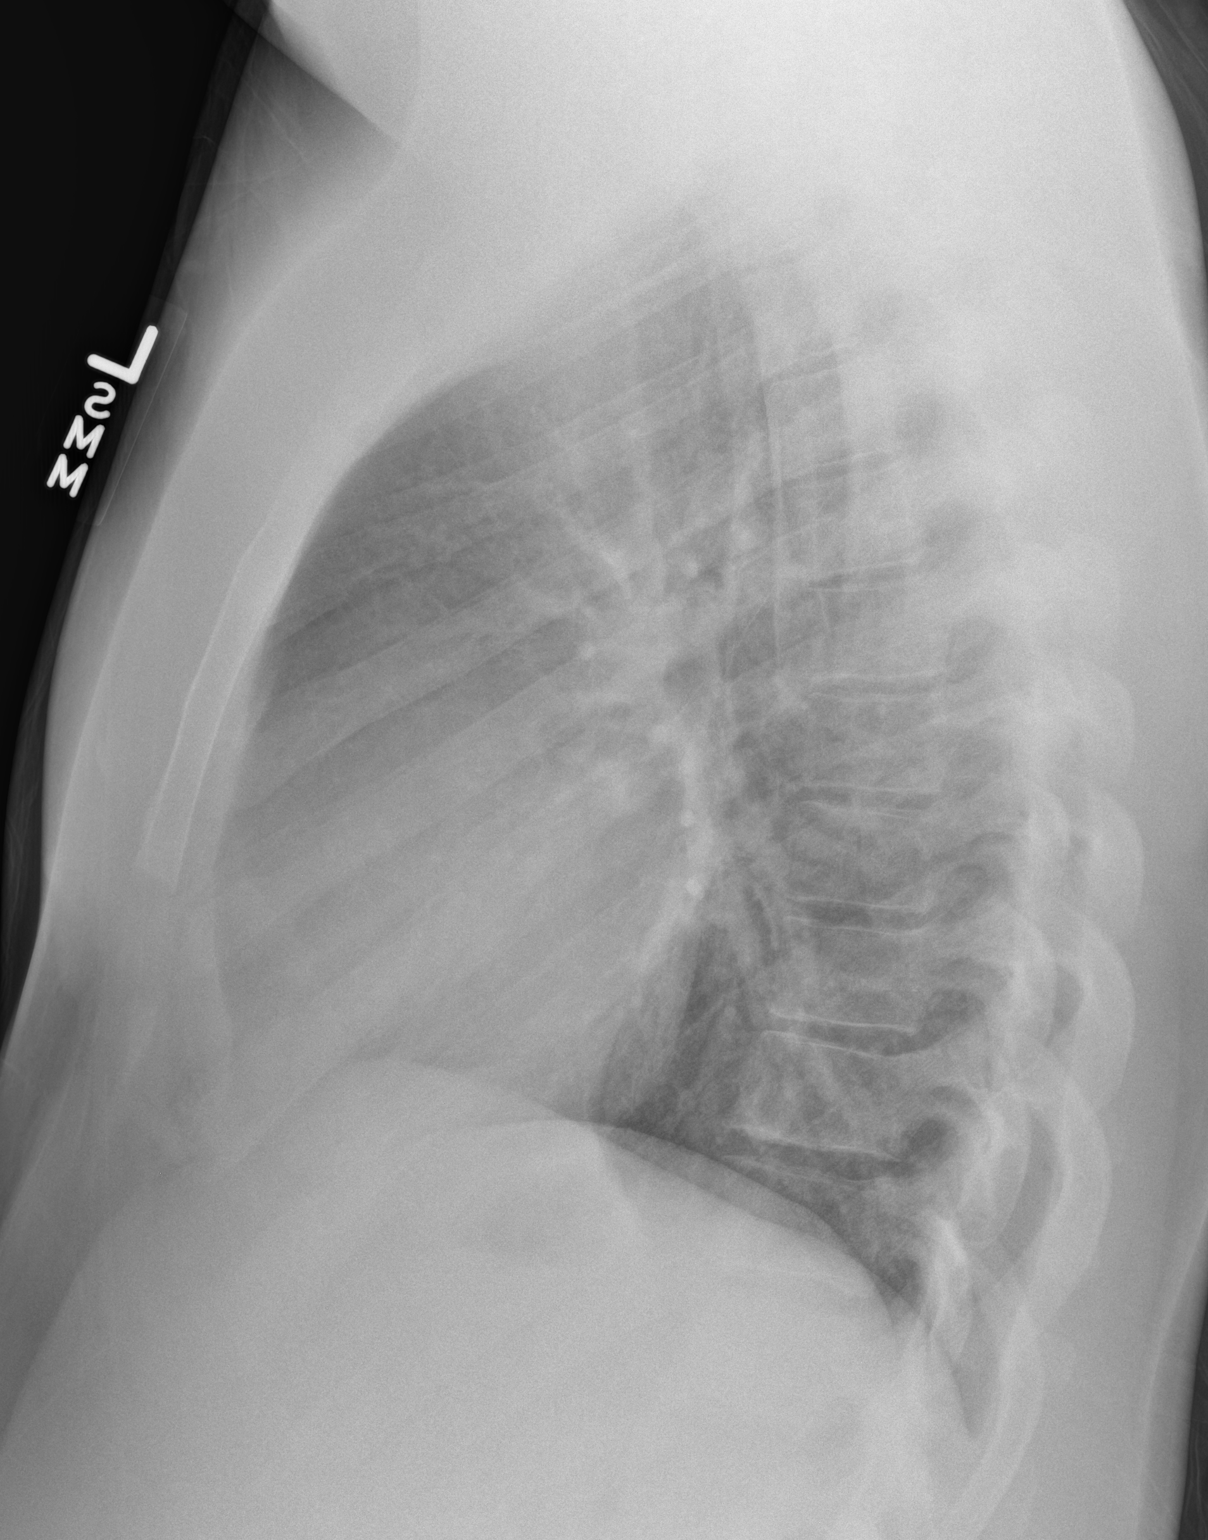

[2 of 2 positions shown; findings below may reference images not displayed]

FINDINGS: Cardiomediastinal silhouette is unremarkable. No acute infiltrate or
pleural effusion. No pulmonary edema. Bony thorax is unremarkable.
IMPRESSION: No active cardiopulmonary disease.

## 2017-12-30 ENCOUNTER — Ambulatory Visit: Payer: Self-pay | Admitting: Podiatry

## 2018-01-20 ENCOUNTER — Ambulatory Visit: Payer: Medicaid Other | Admitting: Podiatry

## 2018-01-20 ENCOUNTER — Encounter: Payer: Self-pay | Admitting: Podiatry

## 2018-01-20 VITALS — BP 150/86 | HR 58 | Wt 190.0 lb

## 2018-01-20 DIAGNOSIS — B351 Tinea unguium: Secondary | ICD-10-CM | POA: Diagnosis not present

## 2018-01-20 DIAGNOSIS — M79672 Pain in left foot: Secondary | ICD-10-CM

## 2018-01-20 DIAGNOSIS — M79671 Pain in right foot: Secondary | ICD-10-CM

## 2018-01-20 NOTE — Progress Notes (Signed)
SUBJECTIVE: 27 y.o. year old male presents complaining of painful calluses and fungal infection for duration of about 13 years.  Asthma being treated. Not on any medication at this time.  Review of Systems  Constitutional: Negative.   HENT: Negative.   Eyes: Negative.   Respiratory: Negative.   Gastrointestinal: Negative.   Genitourinary: Negative.   Musculoskeletal: Negative.   Skin: Negative.      OBJECTIVE: DERMATOLOGIC EXAMINATION: Dystrophic nails x 10. Plantar calluses under both great toes, 2nd bilateral and 5th MPJ left foot.   VASCULAR EXAMINATION OF LOWER LIMBS: All pedal pulses are palpable with normal pulsation.  Temperature gradient from tibial crest to dorsum of foot is within normal bilateral.  NEUROLOGIC EXAMINATION OF THE LOWER LIMBS: All epicritic and tactile sensations grossly intact. Sharp and Dull discriminatory sensations at the plantar ball of hallux is intact bilateral.   MUSCULOSKELETAL EXAMINATION: Positive for Hallux valgus with bunion deformity bilateral.   ASSESSMENT: Painful calluses bilateral plantar. Onychomycosis bilateral.  PLAN: All lesions and toe nails debrided. Home care instruction given using Salsun blue. Return as needed.

## 2018-01-20 NOTE — Patient Instructions (Signed)
Seen for hypertrophic nails, calluses, and fungal nails. All nails and calluses debrided. May use antibacterial shampoo ( Salsun blue shampoo) scrub on both feet after each shower. Return as needed.
